# Patient Record
Sex: Female | Born: 1980 | Race: Black or African American | Hispanic: No | Marital: Single | Smoking: Never smoker
Health system: Southern US, Community
[De-identification: ages and names within clinical notes are randomized; demographics above are authoritative.]

## PROBLEM LIST (undated history)

## (undated) DIAGNOSIS — R000 Tachycardia, unspecified: Secondary | ICD-10-CM

## (undated) DIAGNOSIS — N83209 Unspecified ovarian cyst, unspecified side: Secondary | ICD-10-CM

## (undated) DIAGNOSIS — R079 Chest pain, unspecified: Secondary | ICD-10-CM

## (undated) DIAGNOSIS — K219 Gastro-esophageal reflux disease without esophagitis: Secondary | ICD-10-CM

## (undated) DIAGNOSIS — T7840XA Allergy, unspecified, initial encounter: Secondary | ICD-10-CM

## (undated) DIAGNOSIS — D259 Leiomyoma of uterus, unspecified: Secondary | ICD-10-CM

## (undated) DIAGNOSIS — I10 Essential (primary) hypertension: Secondary | ICD-10-CM

## (undated) DIAGNOSIS — K589 Irritable bowel syndrome without diarrhea: Secondary | ICD-10-CM

## (undated) DIAGNOSIS — K08409 Partial loss of teeth, unspecified cause, unspecified class: Secondary | ICD-10-CM

## (undated) HISTORY — DX: Partial loss of teeth, unspecified cause, unspecified class: K08.409

## (undated) HISTORY — DX: Chest pain, unspecified: R07.9

## (undated) HISTORY — DX: Irritable bowel syndrome without diarrhea: K58.9

## (undated) HISTORY — DX: Essential (primary) hypertension: I10

## (undated) HISTORY — DX: Unspecified ovarian cyst, unspecified side: N83.209

## (undated) HISTORY — DX: Morbid (severe) obesity due to excess calories: E66.01

## (undated) HISTORY — DX: Tachycardia, unspecified: R00.0

## (undated) HISTORY — DX: Allergy, unspecified, initial encounter: T78.40XA

## (undated) HISTORY — DX: Leiomyoma of uterus, unspecified: D25.9

## (undated) HISTORY — DX: Gastro-esophageal reflux disease without esophagitis: K21.9

---

## 2015-12-09 DIAGNOSIS — L309 Dermatitis, unspecified: Secondary | ICD-10-CM | POA: Diagnosis not present

## 2015-12-29 DIAGNOSIS — N7689 Other specified inflammation of vagina and vulva: Secondary | ICD-10-CM | POA: Diagnosis not present

## 2016-03-09 DIAGNOSIS — Z3149 Encounter for other procreative investigation and testing: Secondary | ICD-10-CM | POA: Diagnosis not present

## 2016-03-09 DIAGNOSIS — Z01419 Encounter for gynecological examination (general) (routine) without abnormal findings: Secondary | ICD-10-CM | POA: Diagnosis not present

## 2016-03-09 DIAGNOSIS — Z130 Encounter for screening for diseases of the blood and blood-forming organs and certain disorders involving the immune mechanism: Secondary | ICD-10-CM | POA: Diagnosis not present

## 2016-03-09 DIAGNOSIS — Z1159 Encounter for screening for other viral diseases: Secondary | ICD-10-CM | POA: Diagnosis not present

## 2016-03-09 DIAGNOSIS — N898 Other specified noninflammatory disorders of vagina: Secondary | ICD-10-CM | POA: Diagnosis not present

## 2016-03-09 DIAGNOSIS — Z01411 Encounter for gynecological examination (general) (routine) with abnormal findings: Secondary | ICD-10-CM | POA: Diagnosis not present

## 2016-03-09 DIAGNOSIS — N921 Excessive and frequent menstruation with irregular cycle: Secondary | ICD-10-CM | POA: Diagnosis not present

## 2016-03-09 DIAGNOSIS — Z114 Encounter for screening for human immunodeficiency virus [HIV]: Secondary | ICD-10-CM | POA: Diagnosis not present

## 2016-03-09 DIAGNOSIS — Z13220 Encounter for screening for lipoid disorders: Secondary | ICD-10-CM | POA: Diagnosis not present

## 2016-03-09 DIAGNOSIS — Z0000 Encounter for general adult medical examination without abnormal findings: Secondary | ICD-10-CM | POA: Diagnosis not present

## 2016-03-09 DIAGNOSIS — Z113 Encounter for screening for infections with a predominantly sexual mode of transmission: Secondary | ICD-10-CM | POA: Diagnosis not present

## 2016-03-09 DIAGNOSIS — Z1329 Encounter for screening for other suspected endocrine disorder: Secondary | ICD-10-CM | POA: Diagnosis not present

## 2016-03-09 DIAGNOSIS — Z131 Encounter for screening for diabetes mellitus: Secondary | ICD-10-CM | POA: Diagnosis not present

## 2016-03-09 DIAGNOSIS — R1900 Intra-abdominal and pelvic swelling, mass and lump, unspecified site: Secondary | ICD-10-CM | POA: Diagnosis not present

## 2016-03-24 DIAGNOSIS — Z3149 Encounter for other procreative investigation and testing: Secondary | ICD-10-CM | POA: Diagnosis not present

## 2016-03-24 DIAGNOSIS — N921 Excessive and frequent menstruation with irregular cycle: Secondary | ICD-10-CM | POA: Diagnosis not present

## 2016-03-24 DIAGNOSIS — D259 Leiomyoma of uterus, unspecified: Secondary | ICD-10-CM | POA: Diagnosis not present

## 2016-04-14 ENCOUNTER — Other Ambulatory Visit (HOSPITAL_COMMUNITY): Payer: Self-pay | Admitting: Obstetrics

## 2016-04-14 DIAGNOSIS — Z3149 Encounter for other procreative investigation and testing: Secondary | ICD-10-CM

## 2016-04-21 ENCOUNTER — Encounter (INDEPENDENT_AMBULATORY_CARE_PROVIDER_SITE_OTHER): Payer: Self-pay

## 2016-04-21 ENCOUNTER — Ambulatory Visit (HOSPITAL_COMMUNITY)
Admission: RE | Admit: 2016-04-21 | Discharge: 2016-04-21 | Disposition: A | Discharge status: Home / Self Care | Payer: 59 | Source: Ambulatory Visit | Attending: Obstetrics | Admitting: Obstetrics

## 2016-04-21 ENCOUNTER — Encounter (HOSPITAL_COMMUNITY): Payer: Self-pay

## 2016-04-21 DIAGNOSIS — Z3141 Encounter for fertility testing: Secondary | ICD-10-CM | POA: Diagnosis not present

## 2016-04-21 DIAGNOSIS — Z3149 Encounter for other procreative investigation and testing: Secondary | ICD-10-CM

## 2016-04-21 MED ORDER — IOPAMIDOL (ISOVUE-300) INJECTION 61%
30.0000 mL | Freq: Once | INTRAVENOUS | Status: AC | PRN
  Administered 2016-04-21: 3 mL

## 2016-07-07 DIAGNOSIS — D259 Leiomyoma of uterus, unspecified: Secondary | ICD-10-CM | POA: Diagnosis not present

## 2016-07-07 DIAGNOSIS — R39198 Other difficulties with micturition: Secondary | ICD-10-CM | POA: Diagnosis not present

## 2016-07-07 DIAGNOSIS — N760 Acute vaginitis: Secondary | ICD-10-CM | POA: Diagnosis not present

## 2016-07-07 DIAGNOSIS — Z7251 High risk heterosexual behavior: Secondary | ICD-10-CM | POA: Diagnosis not present

## 2017-04-15 DIAGNOSIS — Z114 Encounter for screening for human immunodeficiency virus [HIV]: Secondary | ICD-10-CM | POA: Diagnosis not present

## 2017-04-15 DIAGNOSIS — N898 Other specified noninflammatory disorders of vagina: Secondary | ICD-10-CM | POA: Diagnosis not present

## 2017-04-15 DIAGNOSIS — Z113 Encounter for screening for infections with a predominantly sexual mode of transmission: Secondary | ICD-10-CM | POA: Diagnosis not present

## 2017-04-15 DIAGNOSIS — Z6838 Body mass index (BMI) 38.0-38.9, adult: Secondary | ICD-10-CM | POA: Diagnosis not present

## 2017-04-15 DIAGNOSIS — Z118 Encounter for screening for other infectious and parasitic diseases: Secondary | ICD-10-CM | POA: Diagnosis not present

## 2017-04-15 DIAGNOSIS — Z01419 Encounter for gynecological examination (general) (routine) without abnormal findings: Secondary | ICD-10-CM | POA: Diagnosis not present

## 2017-04-15 DIAGNOSIS — D259 Leiomyoma of uterus, unspecified: Secondary | ICD-10-CM | POA: Diagnosis not present

## 2017-04-15 DIAGNOSIS — N920 Excessive and frequent menstruation with regular cycle: Secondary | ICD-10-CM | POA: Diagnosis not present

## 2017-04-15 DIAGNOSIS — Z1159 Encounter for screening for other viral diseases: Secondary | ICD-10-CM | POA: Diagnosis not present

## 2017-05-31 ENCOUNTER — Ambulatory Visit (INDEPENDENT_AMBULATORY_CARE_PROVIDER_SITE_OTHER): Payer: 59 | Admitting: Family Medicine

## 2017-05-31 ENCOUNTER — Encounter: Payer: Self-pay | Admitting: Family Medicine

## 2017-05-31 VITALS — BP 138/76 | HR 111 | Temp 98.4°F | Resp 17 | Ht 66.0 in | Wt 237.8 lb

## 2017-05-31 DIAGNOSIS — R238 Other skin changes: Secondary | ICD-10-CM

## 2017-05-31 DIAGNOSIS — K5900 Constipation, unspecified: Secondary | ICD-10-CM

## 2017-05-31 DIAGNOSIS — L309 Dermatitis, unspecified: Secondary | ICD-10-CM | POA: Diagnosis not present

## 2017-05-31 DIAGNOSIS — Z23 Encounter for immunization: Secondary | ICD-10-CM | POA: Diagnosis not present

## 2017-05-31 NOTE — Patient Instructions (Addendum)
1. Continue Tumeric  2. Use head and shoulders classic clean shampoo  3. Add metamucil before breakfast daily and drink plenty of water    IF you received an x-ray today, you will receive an invoice from [facility]. Please contact [facility] at [phone] with questions or concerns regarding your invoice.   IF you received labwork today, you will receive an invoice from [place]. Please contact [facility] at [phone] with questions or concerns regarding your invoice.   Our billing staff will not be able to assist you with questions regarding bills from these companies.  You will be contacted with the lab results as soon as they are available. The fastest way to get your results is to activate your My Chart account. Instructions are located on the last page of this paperwork. If you have not heard from [place] regarding the results in 2 weeks, please contact this office.    Seborrheic Dermatitis, Adult Seborrheic dermatitis is a skin disease that causes red, scaly patches. It usually occurs on the scalp, and it is often called dandruff. The patches may appear on other parts of the body. Skin patches tend to appear where there are many oil glands in the skin. Areas of the body that are commonly affected include:  Scalp.  Skin folds of the body.  Ears.  Eyebrows.  Neck.  Face.  Armpits.  The bearded area of men's faces.  The condition may come and go for no known reason, and it is often long-lasting (chronic). What are the causes? The cause of this condition is not known. What increases the risk? This condition is more likely to develop in people who:  Have certain conditions, such as: ? HIV (human immunodeficiency virus). ? AIDS (acquired immunodeficiency syndrome). ? Parkinson disease. ? Mood disorders, such as depression.  Are 36-43 years old.  What are the signs or symptoms? Symptoms of this condition include:  Thick scales on the  scalp.  Redness on the face or in the armpits.  Skin that is flaky. The flakes may be white or yellow.  Skin that seems oily or dry but is not helped with moisturizers.  Itching or burning in the affected areas.  How is this diagnosed? This condition is diagnosed with a medical history and physical exam. A sample of your skin may be tested (skin biopsy). You may need to see a skin specialist (dermatologist). How is this treated? There is no cure for this condition, but treatment can help to manage the symptoms. You may get treatment to remove scales, lower the risk of skin infection, and reduce swelling or itching. Treatment may include:  Creams that reduce swelling and irritation (steroids).  Creams that reduce skin yeast.  Medicated shampoo, soaps, moisturizing creams, or ointments.  Medicated moisturizing creams or ointments.  Follow these instructions at home:  Apply over-the-counter and prescription medicines only as told by your health care provider.  Use any medicated shampoo, soaps, skin creams, or ointments only as told by your health care provider.  Keep all follow-up visits as told by your health care provider. This is important. Contact a health care provider if:  Your symptoms do not improve with treatment.  Your symptoms get worse.  You have new symptoms. This information is not intended to replace advice given to you by your health care provider. Make sure you discuss any questions you have with your health care provider. Document Released: 08/17/2005 Document Revised: 03/06/2016 Document Reviewed: 12/05/2015 Elsevier Interactive Patient Education  2018  [employer].

## 2017-05-31 NOTE — Progress Notes (Signed)
Chief Complaint  Patient presents with  . New Patient (Initial Visit)    establish care, last pap 2017 with recommendation of recheck every 3 yrs, pe with bloodwork 04/16/17 all normal.  Pt having skin ,scalp and abdomen imflammation, lots of constipation even with lots of fluid intake, breaking out in diff rashes on arms, itchy and rough and bumpy- no change in lotion, soaps or detergents, some spreading to neck and then disappears    HPI Dermatitis Patient reports that she has been having a dry bumpy rash on her forearms then went to her neck and chest for about a week then seemed to resolved once she resumed tumeric supplement She states that she did not change her soaps, lotions or detergents She states that she had some scalp irritation 2 days ago and has yellow flaky dandruff She states that her scalp was itchy and she scratched it until she broke the skin She also noticed that she is breaking out on her face  Seborrhea She started going to a new stylist a month ago  She washes her hair every other week She uses water and cantu sprays She states that she has been using water as the basis of her hair moisture program  Constipation Pt reports that she has 3 BM a week and notices that her bowel movements are small like pellets despite drinking plenty of water. She has not taken any fiber supplement but takes probiotics occasionally.      Past Medical History:  Diagnosis Date  . Allergy    environmental  . IBS (irritable colon syndrome)    with constipation  . Wisdom teeth extracted     Current Outpatient Prescriptions  Medication Sig Dispense Refill  . ibuprofen (ADVIL,MOTRIN) 200 MG tablet Take 200 mg by mouth every 6 (six) hours as needed for cramping.     No current facility-administered medications for this visit.     Allergies: No Known Allergies  No past surgical history on file.  Social History   Social History  . Marital status: Single    Spouse name: N/A    . Number of children: N/A  . Years of education: N/A   Social History Main Topics  . Smoking status: Never Smoker  . Smokeless tobacco: Never Used  . Alcohol use 0.6 oz/week    1 Glasses of wine per week     Comment: socially  . Drug use: No  . Sexual activity: Yes    Birth control/ protection: Condom   Other Topics Concern  . None   Social History Narrative  . None    ROS Review of Systems See HPI Constitution: No fevers or chills No malaise No diaphoresis Skin: No rash or itching Eyes: no blurry vision, no double vision GU: no dysuria or hematuria Neuro: no dizziness or headaches   Objective: Vitals:   05/31/17 1544  BP: 138/76  Pulse: (!) 111  Resp: 17  Temp: 98.4 F (36.9 C)  TempSrc: Oral  SpO2: 98%  Weight: 237 lb 12.8 oz (107.9 kg)  Height: 5\' 6"  (1.676 m)    Physical Exam  Constitutional: She is oriented to person, place, and time. She appears well-developed and well-nourished.  HENT:  Head: Normocephalic and atraumatic.  Eyes: Conjunctivae and EOM are normal.  Cardiovascular: Normal rate, regular rhythm and normal heart sounds.   Pulmonary/Chest: Effort normal and breath sounds normal. No respiratory distress. She has no wheezes.  Neurological: She is alert and oriented to person, place, and  time.  Skin: Capillary refill takes less than 2 seconds.  Dry flaky skin on the scalp Dry skin on the face   Psychiatric: She has a normal mood and affect. Her behavior is normal. Judgment and thought content normal.    Assessment and Plan Zerline was seen today for new patient (initial visit).  Diagnoses and all orders for this visit:  Scalp irritation- head and shoulders classic clean for seborrhea  Constipation, unspecified constipation type- metamucil and hydration  Dermatitis- discussed skin care and avoiding allergens Head and shoulder classic clean  Other orders -     Cancel: Tdap vaccine greater than or equal to 7yo IM -     Cancel: Flu  Vaccine QUAD 36+ mos IM     Bryndon Cumbie A Levone Otten

## 2017-07-21 ENCOUNTER — Ambulatory Visit: Payer: 59 | Admitting: Physician Assistant

## 2017-07-21 DIAGNOSIS — N760 Acute vaginitis: Secondary | ICD-10-CM | POA: Diagnosis not present

## 2017-07-21 DIAGNOSIS — J039 Acute tonsillitis, unspecified: Secondary | ICD-10-CM | POA: Diagnosis not present

## 2017-07-21 DIAGNOSIS — Z3202 Encounter for pregnancy test, result negative: Secondary | ICD-10-CM | POA: Diagnosis not present

## 2017-07-21 DIAGNOSIS — N390 Urinary tract infection, site not specified: Secondary | ICD-10-CM | POA: Diagnosis not present

## 2017-12-08 ENCOUNTER — Encounter: Payer: Self-pay | Admitting: Physician Assistant

## 2018-06-27 ENCOUNTER — Ambulatory Visit: Payer: 59 | Admitting: Family Medicine

## 2018-07-18 ENCOUNTER — Emergency Department (HOSPITAL_BASED_OUTPATIENT_CLINIC_OR_DEPARTMENT_OTHER)
Admission: EM | Admit: 2018-07-18 | Discharge: 2018-07-18 | Disposition: A | Discharge status: Home / Self Care | Payer: BLUE CROSS/BLUE SHIELD | Attending: Emergency Medicine | Admitting: Emergency Medicine

## 2018-07-18 ENCOUNTER — Encounter (HOSPITAL_BASED_OUTPATIENT_CLINIC_OR_DEPARTMENT_OTHER): Payer: Self-pay | Admitting: Emergency Medicine

## 2018-07-18 ENCOUNTER — Other Ambulatory Visit: Payer: Self-pay

## 2018-07-18 DIAGNOSIS — G43909 Migraine, unspecified, not intractable, without status migrainosus: Secondary | ICD-10-CM | POA: Diagnosis not present

## 2018-07-18 DIAGNOSIS — R51 Headache: Secondary | ICD-10-CM | POA: Diagnosis present

## 2018-07-18 MED ORDER — ONDANSETRON 4 MG PO TBDP
4.0000 mg | ORAL_TABLET | Freq: Once | ORAL | Status: AC
  Administered 2018-07-18: 4 mg via ORAL
  Filled 2018-07-18: qty 1

## 2018-07-18 MED ORDER — KETOROLAC TROMETHAMINE 30 MG/ML IJ SOLN
30.0000 mg | Freq: Once | INTRAMUSCULAR | Status: AC
  Administered 2018-07-18: 30 mg via INTRAMUSCULAR
  Filled 2018-07-18: qty 1

## 2018-07-18 NOTE — ED Notes (Signed)
Pt understood dc material. NAD Noted 

## 2018-07-18 NOTE — Discharge Instructions (Addendum)
You were seen today for headache and concerns for blood pressure.  You likely had a migraine.  Your symptoms resolved.  Your blood pressure declined.  This is unclear whether or not this is related to the clonidine you took prior to arrival.  Do not take other peoples medication.  You need to follow-up closely with your primary physician for recheck of blood pressure.

## 2018-07-18 NOTE — ED Triage Notes (Signed)
Pt states when she woke this evening she had a headache. Thought it was from lack of caffeine. While at work she had a mountain dew which made the headache worse. She also complain of left eye blurriness. Around 0100 she took a co workers clonidine 0.01 and 81 mg asa. After she experienced some nausea The blood pressure was still elevated after 1 hours. She has slight discomfort in her left neck. Denies chest pain, shortness of breath.

## 2018-07-18 NOTE — ED Provider Notes (Signed)
[facility] EMERGENCY DEPARTMENT Provider Note   CSN: [id number] Arrival date & time: 07/18/18  0308     History   Chief Complaint Chief Complaint  Patient presents with  . Headache  . Hypertension    HPI Catherine Hood is a 37 y.o. female.  HPI  This is a 37 year old female with a history of IBS who presents with headache and concerns for high blood pressure.  Patient reports that she woke up from a nap yesterday evening before going to work with a headache.  She thought it had to do with not drinking caffeine.  She has not had caffeine in 2 days.  She describes the headache as left-sided and sharp in nature.  Currently she states that it is very dull and rates her pain at 1 out of 10.  However, she states that at that time it was more severe.  On her way to work she noted blurry vision mostly out of her left eye.  She is supposed to wear corrective lenses.  When she got to work she drank a caffeinated beverage.  This did not improve her headache.  She had a coworker take her blood pressure and reports that her blood pressures 180s over 100s.  No known history of high blood pressure.  She took a coworkers clonidine.  She subsequently began to feel nauseous and dizzy.  Currently she states her symptoms have subsided.  She continues to have some left greater than right blurry vision.  She is no longer dizzy or nauseous.  Denies photophobia.  Denies fevers or neck stiffness.  Denies worst headache of her life.  She does report that she had a migraine for years ago and since that time has had at least 2 additional episodes of migraine.  On review of systems she reports some discomfort in her left upper abdomen and lower chest.  She states "I think I have gas."  Past Medical History:  Diagnosis Date  . Allergy    environmental  . IBS (irritable colon syndrome)    with constipation  . Wisdom teeth extracted     There are no active problems to display for this  patient.   History reviewed. No pertinent surgical history.   OB History    Gravida  1   Para      Term      Preterm      AB      Living        SAB      TAB      Ectopic      Multiple      Live Births               Home Medications    Prior to Admission medications   Medication Sig Start Date End Date Taking? Authorizing Provider  ibuprofen (ADVIL,MOTRIN) 200 MG tablet Take 200 mg by mouth every 6 (six) hours as needed for cramping.    [provider]    Family History Family History  Problem Relation Age of Onset  . Hyperlipidemia Mother   . Hypertension Mother   . Hyperlipidemia Father   . Hypertension Father   . Hypertension Sister   . Cancer Maternal Grandmother   . Cancer Maternal Grandfather   . Diabetes Maternal Grandfather   . Cancer Paternal Grandfather     Social History Social History   Tobacco Use  . Smoking status: Never Smoker  . Smokeless tobacco: Never Used  Substance  Use Topics  . Alcohol use: Yes    Alcohol/week: 1.0 standard drinks    Types: 1 Glasses of wine per week    Comment: socially  . Drug use: No     Allergies   Patient has no known allergies.   Review of Systems Review of Systems  Constitutional: Negative for fever.  Respiratory: Negative for shortness of breath.   Cardiovascular: Negative for chest pain.  Gastrointestinal: Positive for abdominal pain and nausea. Negative for vomiting.  Genitourinary: Negative for dysuria.  Neurological: Positive for dizziness and headaches. Negative for weakness and numbness.  All other systems reviewed and are negative.    Physical Exam Updated Vital Signs BP (!) 135/100   Pulse 83   Temp 98 F (36.7 C) (Oral)   Resp 17   Ht 1.651 m (5\' 5" )   Wt 111.1 kg   SpO2 97%   BMI 40.77 kg/m   Physical Exam  Constitutional: She is oriented to person, place, and time. She appears well-developed and well-nourished.  Obese, no acute distress  HENT:  Head:  Normocephalic and atraumatic.  Eyes: Pupils are equal, round, and reactive to light. EOM are normal. Right eye exhibits no nystagmus. Left eye exhibits no nystagmus.  Visual fields intact  Neck: Normal range of motion. Neck supple.  Cardiovascular: Normal rate, regular rhythm and normal heart sounds.  Pulmonary/Chest: Effort normal and breath sounds normal. No respiratory distress. She has no wheezes.  Abdominal: Soft. Bowel sounds are normal.  Neurological: She is alert and oriented to person, place, and time.  Cranial nerves II through XII intact, 5 out of 5 strength in all 4 extremities, no dysmetria to finger-nose-finger  Skin: Skin is warm and dry.  Psychiatric: She has a normal mood and affect.  Nursing note and vitals reviewed.    ED Treatments / Results  Labs (all labs ordered are listed, but only abnormal results are displayed) Labs Reviewed - No data to display  EKG EKG Interpretation  Date/Time:  Monday July 18 2018 03:44:31 EST Ventricular Rate:  86 PR Interval:    QRS Duration: 83 QT Interval:  362 QTC Calculation: 433 R Axis:   -4 Text Interpretation:  Sinus rhythm LVH by voltage Anterior Q waves, possibly due to LVH Borderline T abnormalities, inferior leads NO prior for comparison Confirmed by Thayer Jew [id number]) on 07/18/2018 4:16:41 AM   Radiology No results found.  Procedures Procedures (including critical care time)  Medications Ordered in ED Medications  ondansetron (ZOFRAN-ODT) disintegrating tablet 4 mg (4 mg Oral Given 07/18/18 0357)  ketorolac (TORADOL) 30 MG/ML injection 30 mg (30 mg Intramuscular Given 07/18/18 0356)     Initial Impression / Assessment and Plan / ED Course  I have reviewed the triage vital signs and the nursing notes.  Pertinent labs & imaging results that were available during my care of the patient were reviewed by me and considered in my medical decision making (see chart for details).     Patient presents  with headache earlier yesterday evening.  This was followed by some blurry vision.  She subsequently noted her blood pressure to be high and took a coworkers medication resulting in dizziness and nausea.  She is currently nontoxic-appearing and some of her symptoms have resolved.  Her neurologic exam is reassuring.  Blood pressure upon arrival 153/107.  Given description of pain and history of migraines in the past, it sounds like she likely had a migraine with aura.  Doubt subarachnoid hemorrhage or meningitis.  Unclear whether blood pressure was a contributing factor.  I discussed with her that she should not take other peoples medication.  She may develop rebound hypertension.  Patient was given Toradol.  Visual acuity is 20/70 right, 20/70 left, 20/50 both.  She has no signs or symptoms of stroke.  On recheck, she states she feels back to normal and her blood pressure is 132/91.  Suspect she likely had a migraine.  She does need to have her blood pressure rechecked by her primary physician.  I also reinforced that she does not need to take other peoples medications.  Patient stated understanding.  After history, exam, and medical workup I feel the patient has been appropriately medically screened and is safe for discharge home. Pertinent diagnoses were discussed with the patient. Patient was given return precautions.   Final Clinical Impressions(s) / ED Diagnoses   Final diagnoses:  Migraine without status migrainosus, not intractable, unspecified migraine type    ED Discharge Orders    None       Merryl Hacker, MD 07/18/18 [phone]

## 2018-07-19 ENCOUNTER — Telehealth: Payer: Self-pay | Admitting: Family Medicine

## 2018-07-19 NOTE — Telephone Encounter (Signed)
Copied from [place] [phone]. Topic: General - Call Back - No Documentation >> Jul 19, 2018 11:32 AM Virl Axe D wrote: Reason for CRM: Pt went to the ED and had an EKG. The doctor at the ED did not go over any results and the pt would like to know if Dr. Nolon Rod could take a look at them and interpret and let her know if she needs to come in and be seen sooner than her next OV on 08/25/18. IZ#[phone]

## 2018-07-21 NOTE — Telephone Encounter (Signed)
Please see note below. 

## 2018-08-25 ENCOUNTER — Encounter: Payer: Self-pay | Admitting: Family Medicine

## 2021-06-03 ENCOUNTER — Encounter (HOSPITAL_COMMUNITY): Payer: Self-pay

## 2021-06-03 ENCOUNTER — Emergency Department (HOSPITAL_COMMUNITY)
Admission: EM | Admit: 2021-06-03 | Discharge: 2021-06-04 | Disposition: A | Discharge status: Home / Self Care | Payer: Self-pay | Attending: Emergency Medicine | Admitting: Emergency Medicine

## 2021-06-03 ENCOUNTER — Emergency Department (HOSPITAL_COMMUNITY): Payer: Self-pay

## 2021-06-03 ENCOUNTER — Other Ambulatory Visit: Payer: Self-pay

## 2021-06-03 DIAGNOSIS — R0789 Other chest pain: Secondary | ICD-10-CM | POA: Insufficient documentation

## 2021-06-03 DIAGNOSIS — Z5321 Procedure and treatment not carried out due to patient leaving prior to being seen by health care provider: Secondary | ICD-10-CM | POA: Insufficient documentation

## 2021-06-03 LAB — BASIC METABOLIC PANEL
Anion gap: 9 (ref 5–15)
BUN: 9 mg/dL (ref 6–20)
CO2: 26 mmol/L (ref 22–32)
Calcium: 9.4 mg/dL (ref 8.9–10.3)
Chloride: 101 mmol/L (ref 98–111)
Creatinine, Ser: 0.82 mg/dL (ref 0.44–1.00)
GFR, Estimated: >60 mL/min (ref >60)
Glucose, Bld: 91 mg/dL (ref 70–99)
Potassium: 3.3 mmol/L — ABNORMAL LOW (ref 3.5–5.1)
Sodium: 136 mmol/L (ref 135–145)

## 2021-06-03 LAB — TROPONIN I (HIGH SENSITIVITY)
Troponin I (High Sensitivity): 4 ng/L (ref <18)
Troponin I (High Sensitivity): 3 ng/L (ref <18)

## 2021-06-03 LAB — CBC
HCT: 40.4 % (ref 36.0–46.0)
Hemoglobin: 13.4 g/dL (ref 12.0–15.0)
MCH: 28.2 pg (ref 26.0–34.0)
MCHC: 33.2 g/dL (ref 30.0–36.0)
MCV: 85.1 fL (ref 80.0–100.0)
Platelets: 359 10*3/uL (ref 150–400)
RBC: 4.75 MIL/uL (ref 3.87–5.11)
RDW: 14.3 % (ref 11.5–15.5)
WBC: 8.1 10*3/uL (ref 4.0–10.5)
nRBC: 0 % (ref 0.0–0.2)

## 2021-06-03 LAB — I-STAT BETA HCG BLOOD, ED (MC, WL, AP ONLY): I-stat hCG, quantitative: <5 m[IU]/mL (ref <5)

## 2021-06-03 MED ORDER — METOPROLOL TARTRATE 25 MG PO TABS
100.0000 mg | ORAL_TABLET | Freq: Once | ORAL | Status: AC
  Administered 2021-06-03: 100 mg via ORAL
  Filled 2021-06-03: qty 4

## 2021-06-03 NOTE — ED Provider Notes (Signed)
Emergency Medicine Provider Triage Evaluation Note  DELPHA Hood , a 40 y.o. female  was evaluated in triage.  Pt sent from UC after telling her she was having a heart attack.  She went to urgent care for a physical and EKG because she had chest tightness that was radiating to her left arm. Treated for HTN in past with HCTZ and lisinopril. No longer takes these  Review of Systems  Positive: Chest tightness, arm discomfort Negative: SOB, leg swelling, estrogen use, hemoptysis  Physical Exam  BP (!) 188/122 (BP Location: Left Arm)   Pulse (!) 121   Temp 98.9 F (37.2 C) (Oral)   Resp 18   Ht 5\' 5"  (1.651 m)   Wt 116.6 kg   SpO2 99%   BMI 42.77 kg/m  Gen:   Awake, no distress   Resp:  Normal effort  MSK:   Moves extremities without difficulty  Other:  RRR, CTAB  Medical Decision Making  Medically screening exam initiated at 3:18 PM.  Appropriate orders placed.  Catherine Hood was informed that the remainder of the evaluation will be completed by another provider, this initial triage assessment does not replace that evaluation, and the importance of remaining in the ED until their evaluation is complete.     Rhae Hammock, PA-C 06/03/21 1525    Lorelle Gibbs, DO 06/05/21 1004

## 2021-06-03 NOTE — ED Notes (Signed)
Pt left. 

## 2021-06-03 NOTE — ED Triage Notes (Signed)
Pt reports chest tightness that radiates to her left arm. Denies any medical hx. Pt seen at [facility] and sent here for further eval. Pt given 324 ASA at UC. Resp e.u

## 2021-06-27 ENCOUNTER — Ambulatory Visit (INDEPENDENT_AMBULATORY_CARE_PROVIDER_SITE_OTHER): Payer: No Typology Code available for payment source | Admitting: Internal Medicine

## 2021-06-27 ENCOUNTER — Encounter: Payer: Self-pay | Admitting: Internal Medicine

## 2021-06-27 ENCOUNTER — Other Ambulatory Visit: Payer: Self-pay

## 2021-06-27 VITALS — BP 122/80 | HR 84 | Ht 64.0 in | Wt 255.8 lb

## 2021-06-27 DIAGNOSIS — I10 Essential (primary) hypertension: Secondary | ICD-10-CM | POA: Insufficient documentation

## 2021-06-27 DIAGNOSIS — R079 Chest pain, unspecified: Secondary | ICD-10-CM | POA: Insufficient documentation

## 2021-06-27 DIAGNOSIS — R072 Precordial pain: Secondary | ICD-10-CM | POA: Diagnosis not present

## 2021-06-27 NOTE — Assessment & Plan Note (Addendum)
There are many features to the patient's chest pain syndrome that suggest a noncardiac etiology.  I will obtain an coronary CTA to evaluate for coronary obstructive disease.  Since we do not have a diagnosis of coronary artery disease and her troponins were negative I think we can stop aspirin for now.  Certainly if she has obstructive coronary artery disease that is more than mild we may need to perform a cardiac catheterization.  I will keep follow-up with me open-ended until these tests are available.  Patient agrees with this plan.

## 2021-06-27 NOTE — Assessment & Plan Note (Addendum)
Metoprolol and hydrochlorothiazide were restarted by her primary care provider's office.

## 2021-06-27 NOTE — Patient Instructions (Signed)
Medication Instructions:  Stop aspirin. Continue all other medications.  *If you need a refill on your cardiac medications before your next appointment, please call your pharmacy*   Lab Work: none If you have labs (blood work) drawn today and your tests are completely normal, you will receive your results only by: [place] (if you have MyChart) OR A paper copy in the mail If you have any lab test that is abnormal or we need to change your treatment, we will call you to review the results.   Testing/Procedures: Cardiac CT - see instructions below.  Your physician has requested that you have an echocardiogram. Echocardiography is a painless test that uses sound waves to create images of your heart. It provides your doctor with information about the size and shape of your heart and how well your heart's chambers and valves are working. This procedure takes approximately one hour. There are no restrictions for this procedure.   Follow-Up: As needed.  Other Instructions   Your cardiac CT will be scheduled at one of the below locations:   [facility] [address] [place], [place] [address] [phone]  Please arrive at the [facility] main entrance (entrance A) of [facility] 30 minutes prior to test start time. You can use the FREE valet parking offered at the main entrance (encouraged to control the heart rate for the test) Proceed to the [facility] Radiology Department (first floor) to check-in and test prep.   Please follow these instructions carefully (unless otherwise directed):   On the Night Before the Test: Be sure to Drink plenty of water. Do not consume any caffeinated/decaffeinated beverages or chocolate 12 hours prior to your test. Do not take any antihistamines 12 hours prior to your test. On the Day of the Test: Drink plenty of water until 1 hour prior to the test. Do not eat any food 4 hours prior to the test. You may take  your regular medications prior to the test.  Take metoprolol (Lopressor) 100 mg (2 tablets) two hours prior to test. FEMALES- please wear underwire-free bra if available, avoid dresses & tight clothing      After the Test: Drink plenty of water. After receiving IV contrast, you may experience a mild flushed feeling. This is normal. On occasion, you may experience a mild rash up to 24 hours after the test. This is not dangerous. If this occurs, you can take Benadryl 25 mg and increase your fluid intake. If you experience trouble breathing, this can be serious. If it is severe call 911 IMMEDIATELY. If it is mild, please call our office. If you take any of these medications: Glipizide/Metformin, Avandament, Glucavance, please do not take 48 hours after completing test unless otherwise instructed.  Please allow 2-4 weeks for scheduling of routine cardiac CTs. Some insurance companies require a pre-authorization which may delay scheduling of this test.   For non-scheduling related questions, please contact the cardiac imaging nurse navigator should you have any questions/concerns: Marchia Bond, Cardiac Imaging Nurse Navigator Gordy Clement, Cardiac Imaging Nurse Navigator [facility] Direct Office Dial: [phone]   For scheduling needs, including cancellations and rescheduling, please call [place], [phone].

## 2021-06-27 NOTE — Progress Notes (Signed)
Cardiology Office Note:    Date:  06/27/2021   ID:  [name], DOB 08/20/1981, MRN [id number]  PCP:  Pcp, No   [facility] Providers Cardiologist:  None     Referring MD: Gustavus Bryant, PA-C   Chief Complaint: Chest pain  History of Present Illness:    PROBLEM LIST: 1.  Hypertension   Catherine Hood is a 40 y.o. female with the indicated medical problems here for recommendations regarding chest pain.  The patient presented to the emergency department on October 4 with acute onset chest pain.  Her blood pressure was around 675 mmHg systolic.  Her troponins were negative and EKG was unremarkable.  She was discharged home.  She was seen by her PCP recently.  Hydrochlorothiazide and metoprolol were started.  The patient tells me that on the night before she presented to emergency department she developed chest pain at rest.  She went to bed and was still feeling in pain.  When she got up in the morning it was gone but then when she exerted herself it came back.  In the emergency department she had extensive evaluation which was negative.  Since starting blood pressure medications she has had the pain fleetingly and again mostly at rest.  Occasionally she feels tenderness to palpation over her chest when she feels the pain.  She denies any shortness of breath, presyncope, syncope, palpitations, paroxysmal dyspnea, orthopnea.  She has had no other emergency room visits or hospitalizations.  She denies any severe bleeding or bruising.    Previous Medical/Surgical History:    Past Medical History:  Diagnosis Date   Allergy    environmental   Chest pain    GERD (gastroesophageal reflux disease)    HTN (hypertension)    IBS (irritable colon syndrome)    with constipation   Morbid obesity ([place])    Ovarian cyst    Tachycardia    Uterine leiomyoma    Wisdom teeth extracted     No past surgical history on file.  Current Medications: Current Meds  Medication Sig    cetirizine (ZYRTEC) 10 MG tablet Take 10 mg by mouth daily.   hydrochlorothiazide (HYDRODIURIL) 25 MG tablet Take 25 mg by mouth daily.   ibuprofen (ADVIL,MOTRIN) 200 MG tablet Take 200 mg by mouth every 6 (six) hours as needed for cramping.   metoprolol tartrate (LOPRESSOR) 50 MG tablet Take 50 mg by mouth 2 (two) times daily.   [DISCONTINUED] aspirin 81 MG chewable tablet Chew 81 mg by mouth daily.     Allergies:   Patient has no known allergies.   Social History:    Social History   Socioeconomic History   Marital status: Single    Spouse name: Not on file   Number of children: Not on file   Years of education: Not on file   Highest education level: Not on file  Occupational History   Not on file  Tobacco Use   Smoking status: Never   Smokeless tobacco: Never  Substance and Sexual Activity   Alcohol use: Yes    Alcohol/week: 1.0 standard drink    Types: 1 Glasses of wine per week    Comment: socially   Drug use: No   Sexual activity: Yes    Birth control/protection: Condom  Other Topics Concern   Not on file  Social History Narrative   Not on file   Social Determinants of Health   Financial Resource Strain: Not on file  Food  Insecurity: Not on file  Transportation Needs: Not on file  Physical Activity: Not on file  Stress: Not on file  Social Connections: Not on file     Family History:  The patient's family history includes Cancer in her maternal grandfather, maternal grandmother, and paternal grandfather; Diabetes in her maternal grandfather; Hyperlipidemia in her father and mother; Hypertension in her father, mother, and sister.  ROS:  Please see the history of present illness.  All other systems reviewed and are negative.  EKGs/Labs/Other Studies Reviewed:    The following studies were reviewed today: Recent labs and EKGs  EKG:   The ekg ordered today demonstrates sinus rhythm with no Q waves or ST changes.  It looks like there are small R waves in V2 but  cannot rule out septal infarct pattern.  Recent Labs: 06/03/2021: BUN 9; Creatinine, Ser 0.82; Hemoglobin 13.4; Platelets 359; Potassium 3.3; Sodium 136   Recent Lipid Panel No results found for: CHOL, TRIG, HDL, CHOLHDL, VLDL, LDLCALC, LDLDIRECT   Risk Assessment/Calculations:           Physical Exam:    VS:  BP 122/80 (BP Location: Right Arm, Patient Position: Sitting, Cuff Size: Large)   Pulse 84   Ht 5\' 4"  (1.626 m)   Wt 255 lb 12.8 oz (116 kg)   SpO2 98%   BMI 43.91 kg/m     Wt Readings from Last 3 Encounters:  06/27/21 255 lb 12.8 oz (116 kg)  06/03/21 257 lb (116.6 kg)  07/18/18 245 lb (111.1 kg)     GEN:  Well nourished, well developed in no acute distress HEENT: Normal NECK: No JVD; No carotid bruits LYMPHATICS: No lymphadenopathy CARDIAC: RRR, no murmurs, rubs, gallops RESPIRATORY:  Clear to auscultation without rales, wheezing or rhonchi  ABDOMEN: Soft, non-tender, non-distended MUSCULOSKELETAL:  No edema; No deformity  SKIN: Warm and dry NEUROLOGIC:  Alert and oriented x 3 PSYCHIATRIC:  Normal affect   ASSESSMENT and PLAN   Chest pain of uncertain etiology There are many features to the patient's chest pain syndrome that suggest a noncardiac etiology.  I will obtain an coronary CTA to evaluate for coronary obstructive disease.  Since we do not have a diagnosis of coronary artery disease and her troponins were negative I think we can stop aspirin for now.  Certainly if she has obstructive coronary artery disease that is more than mild we may need to perform a cardiac catheterization.  I will keep follow-up with me open-ended until these tests are available.  Patient agrees with this plan.  Hypertension Metoprolol and hydrochlorothiazide were restarted by her primary care provider's office.              Medication Adjustments/Labs and Tests Ordered: Current medicines are reviewed at length with the patient today.  Concerns regarding medicines are  outlined above.  Orders Placed This Encounter  Procedures   CT CORONARY MORPH W/CTA COR W/SCORE W/CA W/CM &/OR WO/CM   EKG 12-Lead   ECHOCARDIOGRAM COMPLETE    No orders of the defined types were placed in this encounter.   Patient Instructions  Medication Instructions:  Stop aspirin. Continue all other medications.  *If you need a refill on your cardiac medications before your next appointment, please call your pharmacy*   Lab Work: none If you have labs (blood work) drawn today and your tests are completely normal, you will receive your results only by: [place] (if you have MyChart) OR A paper copy in the mail If  you have any lab test that is abnormal or we need to change your treatment, we will call you to review the results.   Testing/Procedures: Cardiac CT - see instructions below.  Your physician has requested that you have an echocardiogram. Echocardiography is a painless test that uses sound waves to create images of your heart. It provides your doctor with information about the size and shape of your heart and how well your heart's chambers and valves are working. This procedure takes approximately one hour. There are no restrictions for this procedure.   Follow-Up: As needed.  Other Instructions   Your cardiac CT will be scheduled at one of the below locations:   [facility] [address] [place], [place] [address] [phone]  Please arrive at the [facility] main entrance (entrance A) of [facility] 30 minutes prior to test start time. You can use the FREE valet parking offered at the main entrance (encouraged to control the heart rate for the test) Proceed to the [facility] Radiology Department (first floor) to check-in and test prep.   Please follow these instructions carefully (unless otherwise directed):   On the Night Before the Test: Be sure to Drink plenty of water. Do not consume any caffeinated/decaffeinated  beverages or chocolate 12 hours prior to your test. Do not take any antihistamines 12 hours prior to your test. On the Day of the Test: Drink plenty of water until 1 hour prior to the test. Do not eat any food 4 hours prior to the test. You may take your regular medications prior to the test.  Take metoprolol (Lopressor) 100 mg (2 tablets) two hours prior to test. FEMALES- please wear underwire-free bra if available, avoid dresses & tight clothing      After the Test: Drink plenty of water. After receiving IV contrast, you may experience a mild flushed feeling. This is normal. On occasion, you may experience a mild rash up to 24 hours after the test. This is not dangerous. If this occurs, you can take Benadryl 25 mg and increase your fluid intake. If you experience trouble breathing, this can be serious. If it is severe call 911 IMMEDIATELY. If it is mild, please call our office. If you take any of these medications: Glipizide/Metformin, Avandament, Glucavance, please do not take 48 hours after completing test unless otherwise instructed.  Please allow 2-4 weeks for scheduling of routine cardiac CTs. Some insurance companies require a pre-authorization which may delay scheduling of this test.   For non-scheduling related questions, please contact the cardiac imaging nurse navigator should you have any questions/concerns: Marchia Bond, Cardiac Imaging Nurse Navigator Gordy Clement, Cardiac Imaging Nurse Navigator [facility] Direct Office Dial: [phone]   For scheduling needs, including cancellations and rescheduling, please call [place], [phone].     Signed, Early Osmond, MD  06/27/2021 2:12 PM    [facility]

## 2021-07-04 ENCOUNTER — Telehealth (HOSPITAL_COMMUNITY): Payer: Self-pay | Admitting: *Deleted

## 2021-07-04 NOTE — Telephone Encounter (Signed)
Reaching out to patient to offer assistance regarding upcoming cardiac imaging study; pt verbalizes understanding of appt date/time, parking situation and where to check in, pre-test NPO status and medications ordered, and verified current allergies; name and call back number provided for further questions should they arise ° °Deloros Beretta RN Navigator Cardiac Imaging °[facility] °[phone] office °[phone] cell  ° °Patient to take 100mg metoprolol tartrate two hours prior to cardiac CT scan. °

## 2021-07-08 ENCOUNTER — Other Ambulatory Visit: Payer: Self-pay

## 2021-07-08 ENCOUNTER — Ambulatory Visit (HOSPITAL_COMMUNITY)
Admission: RE | Admit: 2021-07-08 | Discharge: 2021-07-08 | Disposition: A | Discharge status: Home / Self Care | Payer: No Typology Code available for payment source | Source: Ambulatory Visit | Attending: Internal Medicine | Admitting: Internal Medicine

## 2021-07-08 DIAGNOSIS — R072 Precordial pain: Secondary | ICD-10-CM | POA: Insufficient documentation

## 2021-07-08 MED ORDER — METOPROLOL TARTRATE 5 MG/5ML IV SOLN
10.0000 mg | INTRAVENOUS | Status: DC | PRN
  Administered 2021-07-08: 10 mg via INTRAVENOUS

## 2021-07-08 MED ORDER — DILTIAZEM HCL 25 MG/5ML IV SOLN
10.0000 mg | INTRAVENOUS | Status: DC | PRN
  Administered 2021-07-08: 10 mg via INTRAVENOUS

## 2021-07-08 MED ORDER — METOPROLOL TARTRATE 5 MG/5ML IV SOLN
INTRAVENOUS | Status: AC
  Administered 2021-07-08: 10 mg via INTRAVENOUS
  Filled 2021-07-08: qty 20

## 2021-07-08 MED ORDER — NITROGLYCERIN 0.4 MG SL SUBL
SUBLINGUAL_TABLET | SUBLINGUAL | Status: AC
  Filled 2021-07-08: qty 2

## 2021-07-08 MED ORDER — NITROGLYCERIN 0.4 MG SL SUBL
0.8000 mg | SUBLINGUAL_TABLET | Freq: Once | SUBLINGUAL | Status: AC
  Administered 2021-07-08: 0.8 mg via SUBLINGUAL

## 2021-07-08 MED ORDER — IOHEXOL 350 MG/ML SOLN
100.0000 mL | Freq: Once | INTRAVENOUS | Status: AC | PRN
  Administered 2021-07-08: 100 mL via INTRAVENOUS

## 2021-07-08 MED ORDER — DILTIAZEM HCL 25 MG/5ML IV SOLN
INTRAVENOUS | Status: AC
  Administered 2021-07-08: 10 mg via INTRAVENOUS
  Filled 2021-07-08: qty 5

## 2021-07-17 ENCOUNTER — Ambulatory Visit (HOSPITAL_COMMUNITY): Payer: No Typology Code available for payment source | Attending: Cardiology

## 2021-07-17 ENCOUNTER — Other Ambulatory Visit: Payer: Self-pay

## 2021-07-17 DIAGNOSIS — R072 Precordial pain: Secondary | ICD-10-CM

## 2021-07-17 DIAGNOSIS — I10 Essential (primary) hypertension: Secondary | ICD-10-CM | POA: Diagnosis not present

## 2021-07-17 DIAGNOSIS — R079 Chest pain, unspecified: Secondary | ICD-10-CM | POA: Diagnosis present

## 2021-07-17 LAB — ECHOCARDIOGRAM COMPLETE
Area-P 1/2: 4.52 cm2
S' Lateral: 2.2 cm

## 2022-06-26 IMAGING — DX DG CHEST 2V
2 series · 2 of 2 positions shown · non-contrast
Comparison: None.

CLINICAL DATA: Chest pain

EXAM:
CHEST - 2 VIEW

[chest pa]
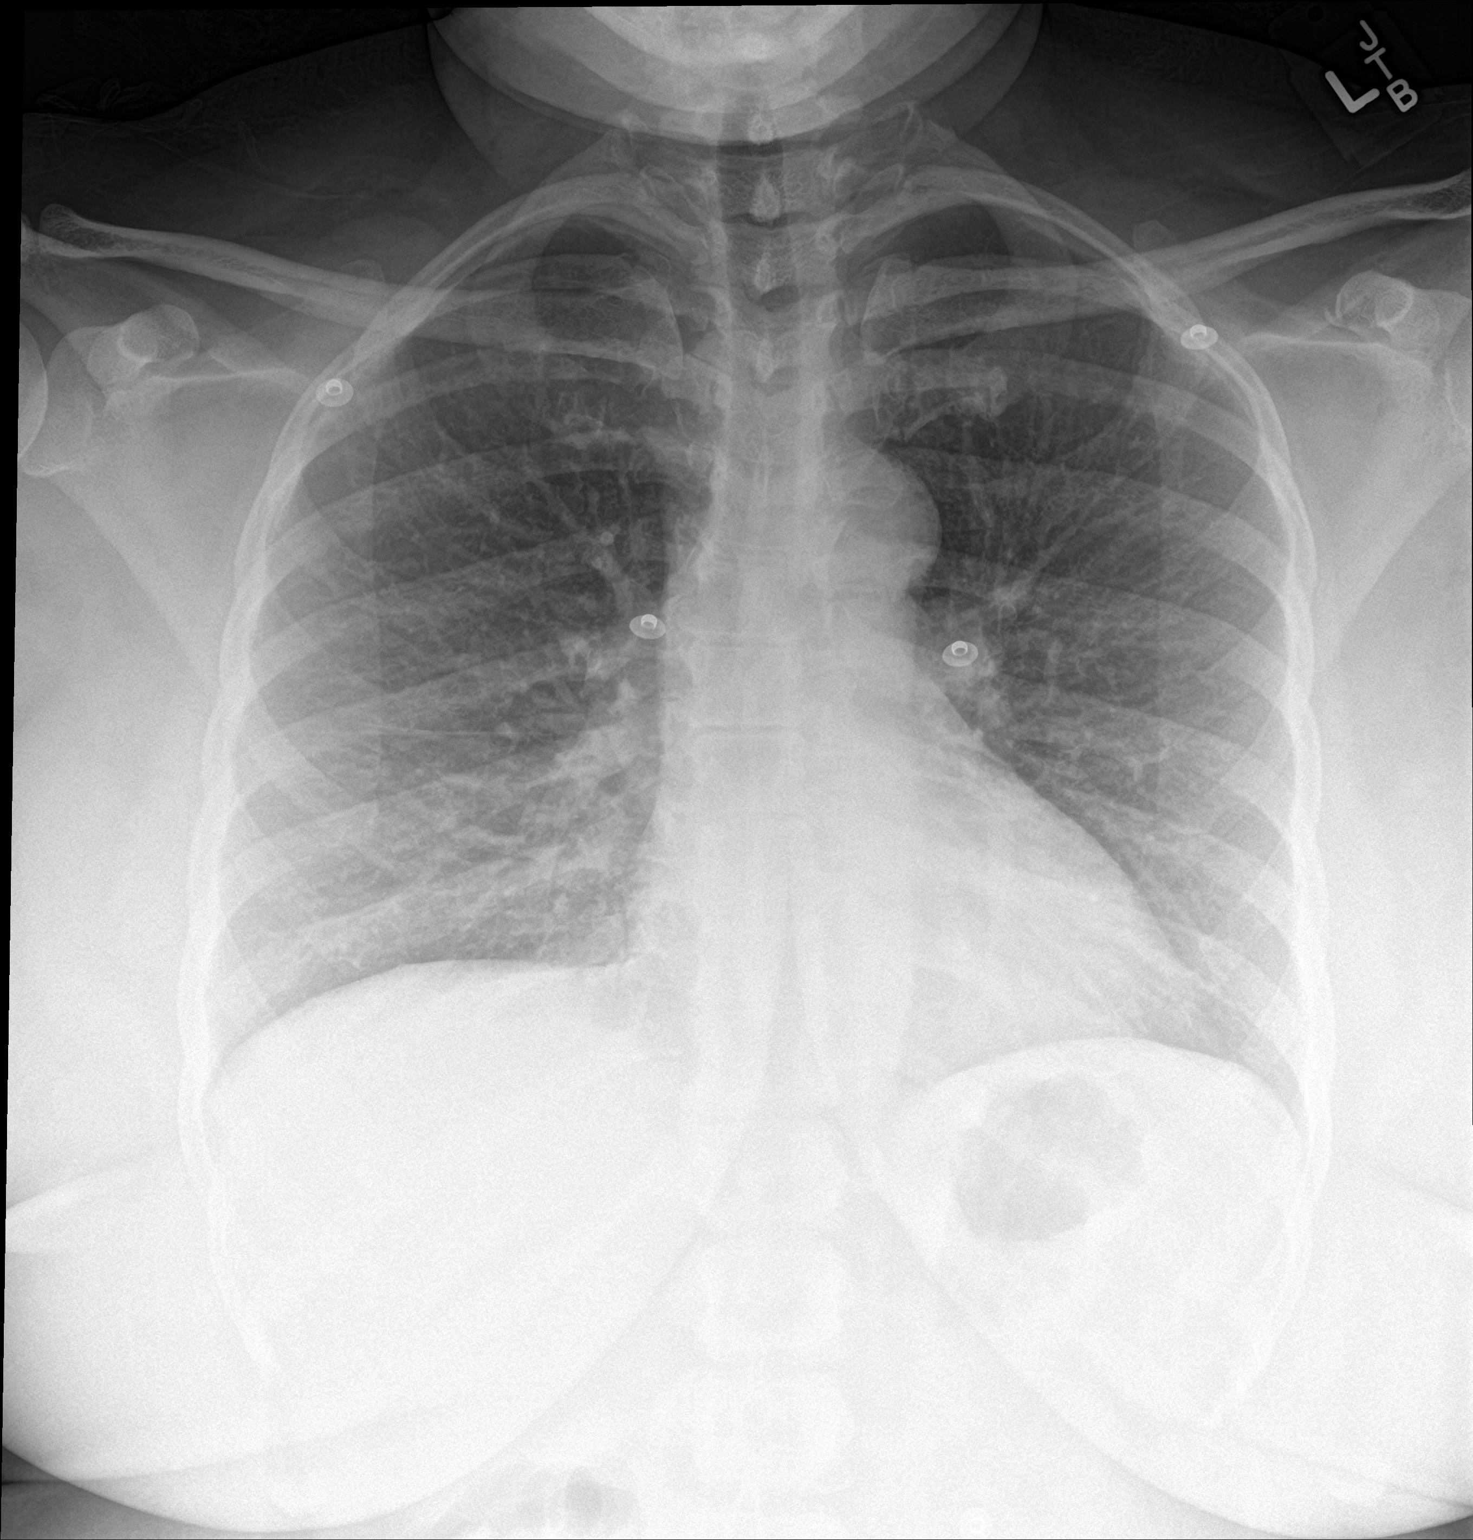

[chest lat]
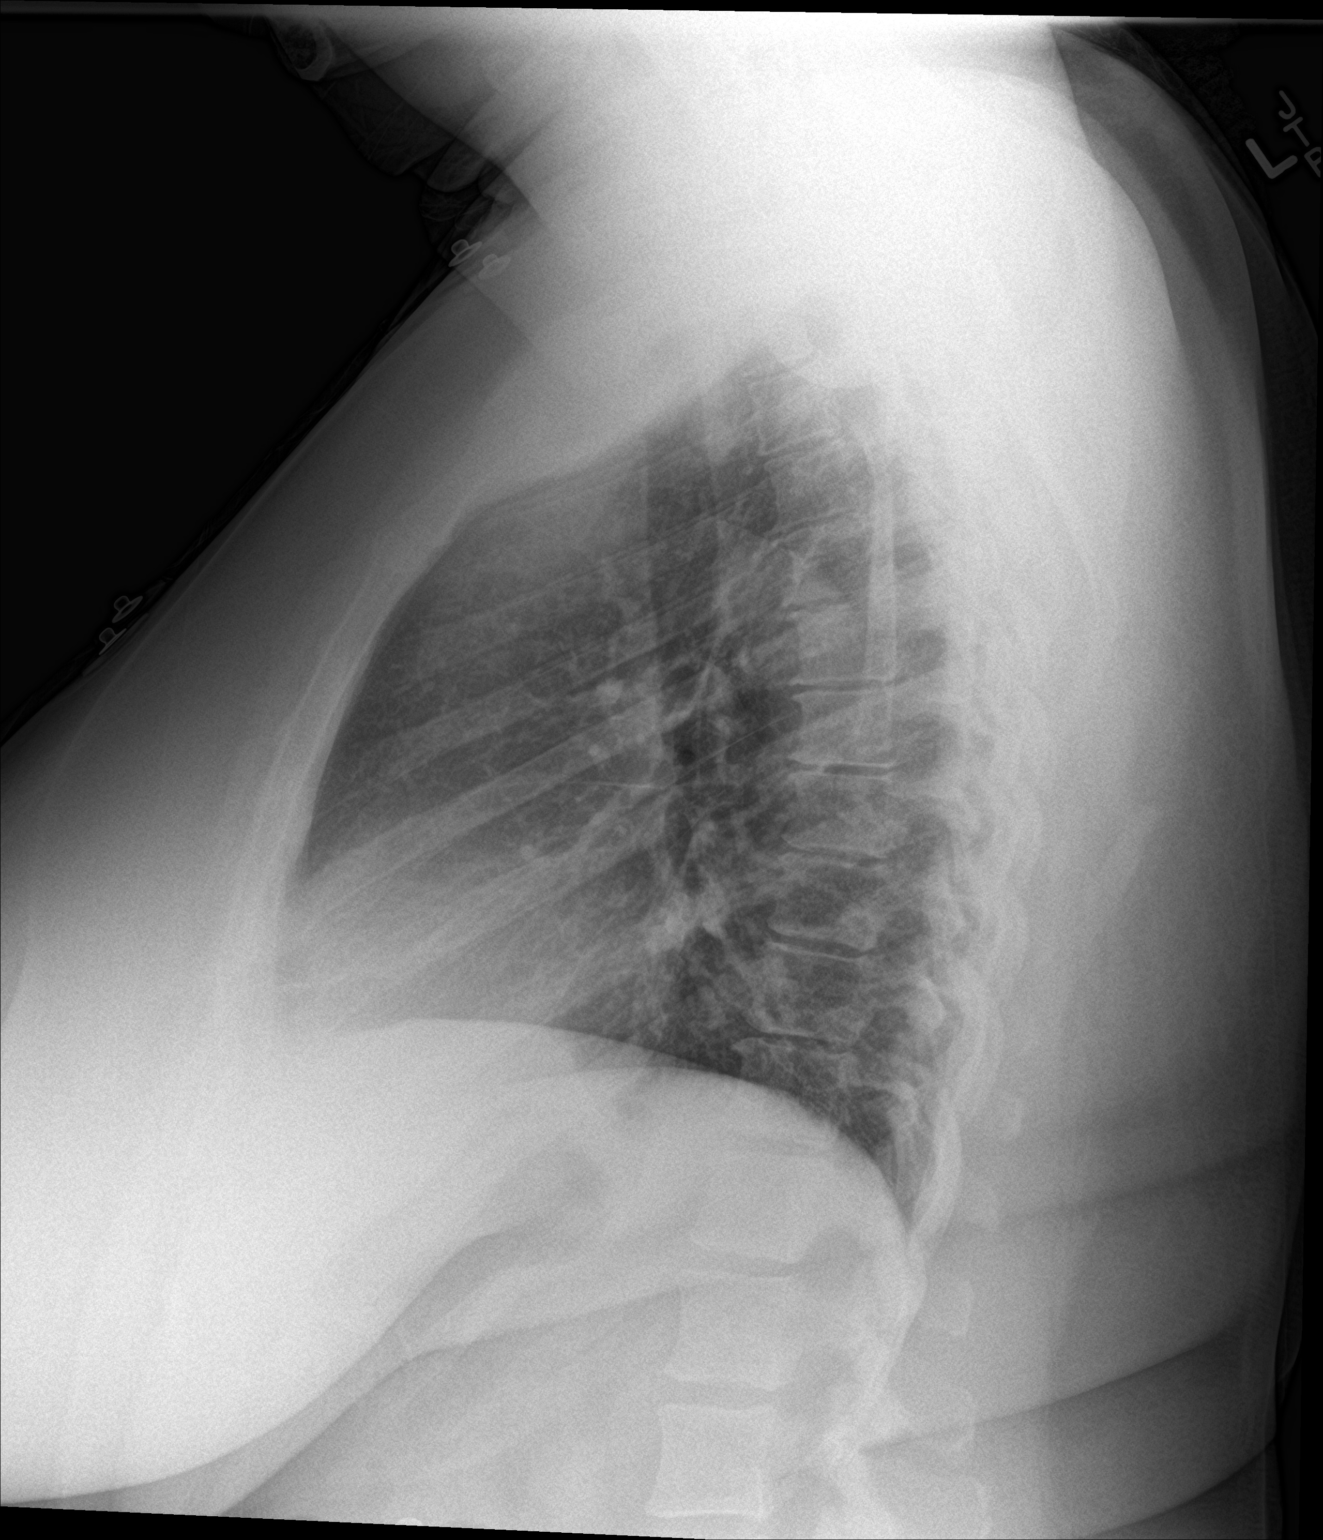

[2 of 2 positions shown; findings below may reference images not displayed]

FINDINGS: The heart size and mediastinal contours are within normal limits. No
focal pulmonary opacity, pleural effusion, or pneumothorax. The
visualized skeletal structures are unremarkable.
IMPRESSION: No active cardiopulmonary disease.

## 2022-07-31 IMAGING — CT CT HEART MORP W/ CTA COR W/ SCORE W/ CA W/CM &/OR W/O CM
4 of 7 series · 8 of 20 positions shown, 9 images · non-contrast
Comparison: None.
COMPARISON: None.

Addendum:
EXAM:
OVER-READ INTERPRETATION  CT CHEST

The following report is an over-read performed by radiologist Dr.
Alva On [REDACTED] on 07/08/2021. This
over-read does not include interpretation of cardiac or coronary
anatomy or pathology. The coronary calcium score/coronary CTA
interpretation by the cardiologist is attached.
CLINICAL DATA: This is a 40 year old female with chest pain.
Medical history includes hypertension.
Cardiac/Coronary  CTA
TECHNIQUE: The patient was scanned on a [employer] Force scanner.

[Series 6: ts syst sharp · axial · 0.39mm/px · z∈[+1258,+1293]mm · 2 of 266 slices shown]
[im 89/266  lung]
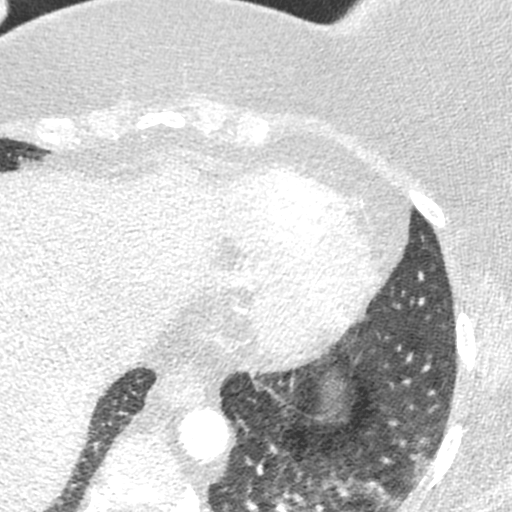
[im 177/266  lung]
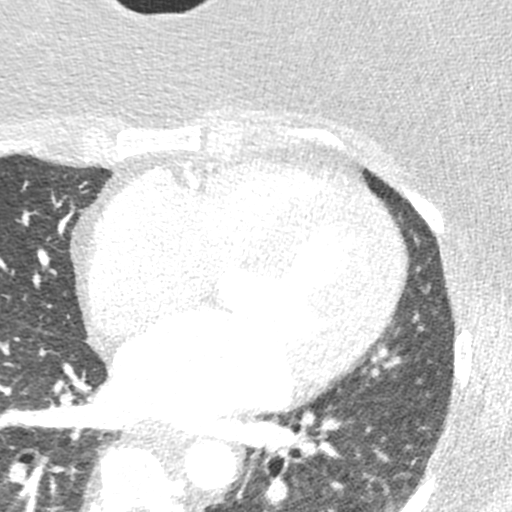

[Series 7: ts diast sharp · axial · 0.39mm/px · z∈[+1258,+1293]mm · 2 of 266 slices shown]
[im 89/266  lung]
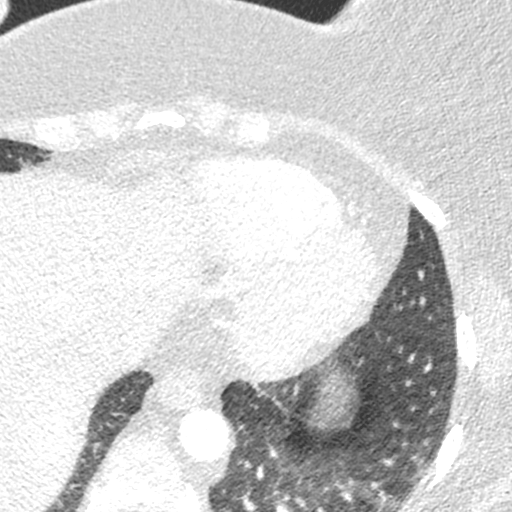
[im 177/266  lung]
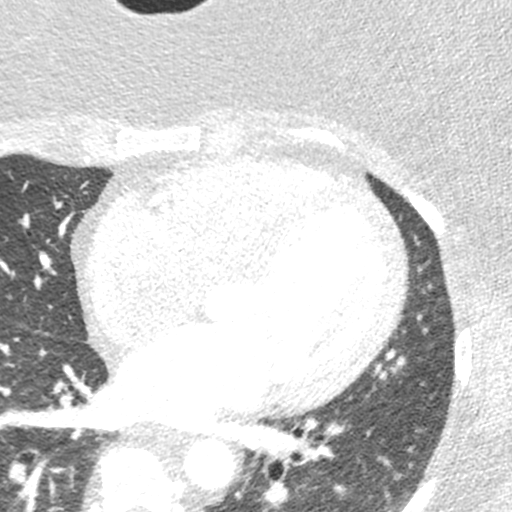

[Series 8: best syst · axial · 0.39mm/px · z∈[+1258,+1293]mm · 2 of 266 slices shown, 3 images]
[im 89/266  vessel]
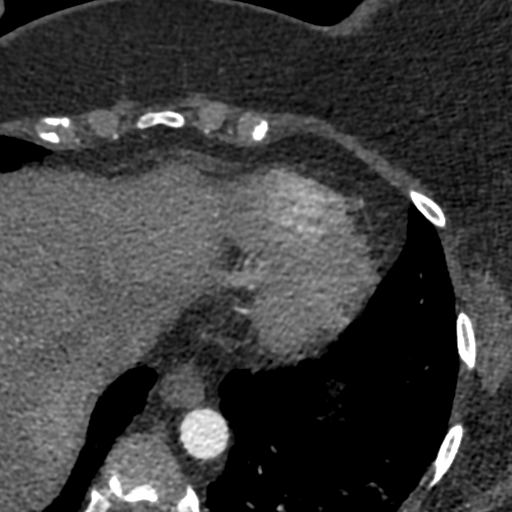
[im 89/266  lung]
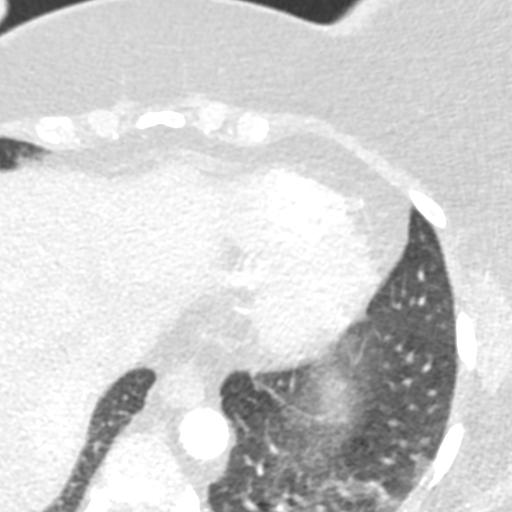
[im 177/266  vessel]
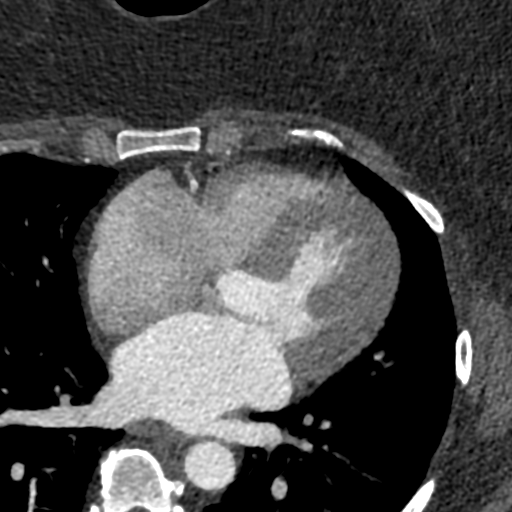

[Series 9: best diast · axial · 0.39mm/px · z∈[+1258,+1293]mm · 2 of 266 slices shown]
[im 89/266  vessel]
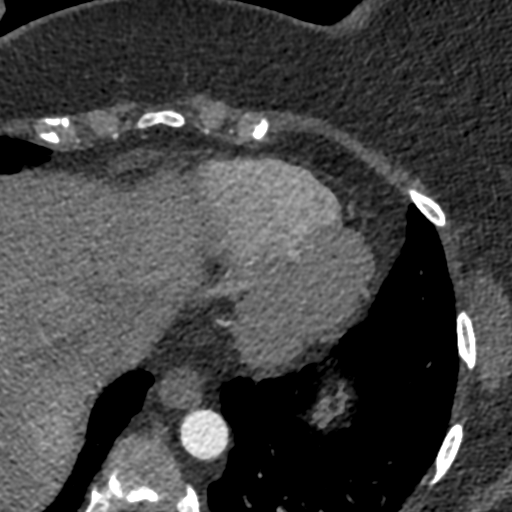
[im 177/266  vessel]
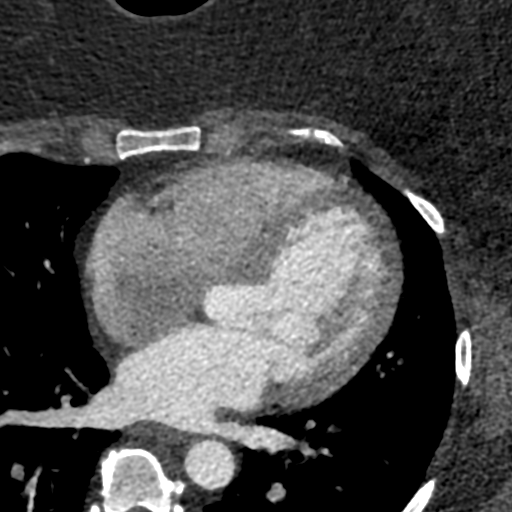

[8 of 20 positions shown; findings below may reference images not displayed]

FINDINGS: Within the visualized portions of the thorax there are no suspicious
appearing pulmonary nodules or masses, there is no acute
consolidative airspace disease, no pleural effusions, no
pneumothorax and no lymphadenopathy. Visualized portions of the
upper abdomen are unremarkable. There are no aggressive appearing
lytic or blastic lesions noted in the visualized portions of the
skeleton.
IMPRESSION: No significant incidental noncardiac findings are noted.
FINDINGS: A 100 kV prospective scan was triggered in the descending thoracic
aorta at 111 HU's. Axial non-contrast 3 mm slices were carried out
through the heart. The data set was analyzed on a dedicated work
station and scored using the Agatson method. Gantry rotation speed
was 250 msecs and collimation was .6 mm. No beta blockade and 0.8 mg
of sl NTG was given. The 3D data set was reconstructed in 5%
intervals of the 67-82 % of the R-R cycle. Diastolic phases were
analyzed on a dedicated work station using MPR, MIP and VRT modes.
The patient received 80 cc of contrast.

Study Quality: Good with misregistration artifact

Aorta: Normal size.  No calcifications.  No dissection.

Aortic Valve:  Trileaflet.  No calcifications.

Coronary Arteries:  Normal coronary origin.  Right dominance.

RCA is a large dominant artery that gives rise to PDA and PLA. There
is no plaque.

Left main is a large artery that gives rise to LAD and LCX arteries.

LAD is a large vessel that has no plaque.

LCX is a non-dominant artery that gives rise to one large OM1
branch. There is no plaque.

Coronary Calcium Score:

Left main: 0

Left anterior descending artery: 0

Left circumflex artery: 0

Right coronary artery: 0

Total: 0

Percentile: 0

Other findings:

Normal pulmonary vein drainage into the left atrium.

Normal left atrial appendage without a thrombus.

Normal size of the pulmonary artery.
IMPRESSION: 1. Coronary calcium score of 0. This was 0 percentile for age and
sex matched control.

2. Normal coronary origin with right dominance.

3. No evidence of CAD. CAD-RADS 0. No evidence of CAD (0%). Consider
non-atherosclerotic causes of chest pain.

*** End of Addendum ***
EXAM:
OVER-READ INTERPRETATION  CT CHEST

The following report is an over-read performed by radiologist Dr.
Alva On [REDACTED] on 07/08/2021. This
over-read does not include interpretation of cardiac or coronary
anatomy or pathology. The coronary calcium score/coronary CTA
interpretation by the cardiologist is attached.
FINDINGS: Within the visualized portions of the thorax there are no suspicious
appearing pulmonary nodules or masses, there is no acute
consolidative airspace disease, no pleural effusions, no
pneumothorax and no lymphadenopathy. Visualized portions of the
upper abdomen are unremarkable. There are no aggressive appearing
lytic or blastic lesions noted in the visualized portions of the
skeleton.
IMPRESSION: No significant incidental noncardiac findings are noted.
